# Patient Record
Sex: Male | Born: 1985 | Race: Black or African American | Hispanic: No | Marital: Single | Smoking: Current every day smoker
Health system: Southern US, Community
[De-identification: ages and names within clinical notes are randomized; demographics above are authoritative.]

## PROBLEM LIST (undated history)

## (undated) HISTORY — PX: HERNIA REPAIR: SHX51

---

## 2014-09-26 ENCOUNTER — Emergency Department (HOSPITAL_BASED_OUTPATIENT_CLINIC_OR_DEPARTMENT_OTHER)
Admission: EM | Admit: 2014-09-26 | Discharge: 2014-09-26 | Disposition: A | Discharge status: Home / Self Care | Payer: Self-pay | Attending: Emergency Medicine | Admitting: Emergency Medicine

## 2014-09-26 ENCOUNTER — Encounter (HOSPITAL_BASED_OUTPATIENT_CLINIC_OR_DEPARTMENT_OTHER): Payer: Self-pay | Admitting: Emergency Medicine

## 2014-09-26 DIAGNOSIS — G4489 Other headache syndrome: Secondary | ICD-10-CM | POA: Insufficient documentation

## 2014-09-26 DIAGNOSIS — Z720 Tobacco use: Secondary | ICD-10-CM | POA: Insufficient documentation

## 2014-09-26 MED ORDER — METOCLOPRAMIDE HCL 5 MG/ML IJ SOLN
10.0000 mg | INTRAMUSCULAR | Status: AC
  Administered 2014-09-26: 10 mg via INTRAVENOUS
  Filled 2014-09-26: qty 2

## 2014-09-26 MED ORDER — KETOROLAC TROMETHAMINE 30 MG/ML IJ SOLN
30.0000 mg | Freq: Once | INTRAMUSCULAR | Status: AC
  Administered 2014-09-26: 30 mg via INTRAVENOUS
  Filled 2014-09-26: qty 1

## 2014-09-26 MED ORDER — DIPHENHYDRAMINE HCL 50 MG/ML IJ SOLN
25.0000 mg | Freq: Once | INTRAMUSCULAR | Status: AC
  Administered 2014-09-26: 25 mg via INTRAVENOUS
  Filled 2014-09-26: qty 1

## 2014-09-26 MED ORDER — SODIUM CHLORIDE 0.9 % IV BOLUS (SEPSIS)
1000.0000 mL | Freq: Once | INTRAVENOUS | Status: AC
  Administered 2014-09-26: 1000 mL via INTRAVENOUS

## 2014-09-26 NOTE — ED Notes (Addendum)
Pt presents to ED with complaints of headache since yesterday and hands feel "tingly" since today.  PT also reports neck and facial pain.

## 2014-09-26 NOTE — Discharge Instructions (Signed)
Drink plenty of water to maintain hydration. Refer to attached documents for further evaluation.

## 2014-09-26 NOTE — ED Provider Notes (Signed)
CSN: [id number]     Arrival date & time 09/26/14  1316 History   First MD Initiated Contact with Patient 09/26/14 1334     Chief Complaint  Patient presents with  . Headache     (Consider location/radiation/quality/duration/timing/severity/associated sxs/prior Treatment) HPI Comments: Patient is a 28 year old male with a past medical history of migraines who presents with a headache for 1 day. Patient reports a gradual onset and progressive worsening of the headache. The pain is sharp, constant and is located in generalized head without radiation. Patient has tried nothing for symptoms without relief. No alleviating/aggravating factors. Patient reports associated nausea and photophobia. Patient denies fever, vomiting, diarrhea, numbness/tingling, weakness, visual changes, congestion, chest pain, SOB, abdominal pain.      History reviewed. No pertinent past medical history. History reviewed. No pertinent past surgical history. No family history on file. History  Substance Use Topics  . Smoking status: Current Every Day Smoker  . Smokeless tobacco: Not on file  . Alcohol Use: Not on file    Review of Systems  Constitutional: Negative for fever, chills and fatigue.  HENT: Negative for trouble swallowing.   Eyes: Negative for visual disturbance.  Respiratory: Negative for shortness of breath.   Cardiovascular: Negative for chest pain and palpitations.  Gastrointestinal: Negative for nausea, vomiting, abdominal pain and diarrhea.  Genitourinary: Negative for dysuria and difficulty urinating.  Musculoskeletal: Negative for arthralgias and neck pain.  Skin: Negative for color change.  Neurological: Positive for headaches. Negative for dizziness and weakness.  Psychiatric/Behavioral: Negative for dysphoric mood.      Allergies  Review of patient's allergies indicates no known allergies.  Home Medications   Prior to Admission medications   Not on File   BP 160/100 mmHg   Pulse 68  Temp(Src) 98.2 F (36.8 C) (Oral)  Resp 18  Ht 6\' 1"  (1.854 m)  Wt 152 lb (68.947 kg)  BMI 20.06 kg/m2  SpO2 98% Physical Exam  Constitutional: He is oriented to person, place, and time. He appears well-developed and well-nourished. No distress.  HENT:  Head: Normocephalic and atraumatic.  Eyes: Conjunctivae and EOM are normal.  Neck: Normal range of motion.  Cardiovascular: Normal rate and regular rhythm.  Exam reveals no gallop and no friction rub.   No murmur heard. Pulmonary/Chest: Effort normal and breath sounds normal. He has no wheezes. He has no rales. He exhibits no tenderness.  Abdominal: Soft. He exhibits no distension. There is no tenderness. There is no rebound.  Musculoskeletal: Normal range of motion.  Neurological: He is alert and oriented to person, place, and time. No cranial nerve deficit. Coordination normal.  Patient abel to ambulate without difficulty. Speech is goal-oriented. Moves limbs without ataxia.   Skin: Skin is warm and dry.  Psychiatric: He has a normal mood and affect. His behavior is normal.  Nursing note and vitals reviewed.   ED Course  Procedures (including critical care time) Labs Review Labs Reviewed - No data to display  Imaging Review No results found.   EKG Interpretation None      MDM   Final diagnoses:  Other headache syndrome    1:54 PM Patient will have migraine cocktail. Vitals stable and patient afebrile. No neuro deficits or meningeal signs.   4:16 PM Patient feeling better and will be discharged. Vitals stable and patient afebrile.    Emilia BeckKaitlyn Reona Zendejas, PA-C 09/26/14 1620  Vanetta MuldersScott Zackowski, MD 09/28/14 [phone]

## 2019-04-28 ENCOUNTER — Encounter (HOSPITAL_BASED_OUTPATIENT_CLINIC_OR_DEPARTMENT_OTHER): Payer: Self-pay | Admitting: Emergency Medicine

## 2019-04-28 ENCOUNTER — Other Ambulatory Visit: Payer: Self-pay

## 2019-04-28 ENCOUNTER — Emergency Department (HOSPITAL_BASED_OUTPATIENT_CLINIC_OR_DEPARTMENT_OTHER): Payer: Self-pay

## 2019-04-28 ENCOUNTER — Emergency Department (HOSPITAL_BASED_OUTPATIENT_CLINIC_OR_DEPARTMENT_OTHER)
Admission: EM | Admit: 2019-04-28 | Discharge: 2019-04-28 | Disposition: A | Discharge status: Home / Self Care | Payer: Self-pay | Attending: Emergency Medicine | Admitting: Emergency Medicine

## 2019-04-28 DIAGNOSIS — F17290 Nicotine dependence, other tobacco product, uncomplicated: Secondary | ICD-10-CM | POA: Insufficient documentation

## 2019-04-28 DIAGNOSIS — F1210 Cannabis abuse, uncomplicated: Secondary | ICD-10-CM | POA: Insufficient documentation

## 2019-04-28 DIAGNOSIS — T148XXA Other injury of unspecified body region, initial encounter: Secondary | ICD-10-CM | POA: Insufficient documentation

## 2019-04-28 DIAGNOSIS — M545 Low back pain, unspecified: Secondary | ICD-10-CM

## 2019-04-28 DIAGNOSIS — Y998 Other external cause status: Secondary | ICD-10-CM | POA: Insufficient documentation

## 2019-04-28 DIAGNOSIS — Y9389 Activity, other specified: Secondary | ICD-10-CM | POA: Insufficient documentation

## 2019-04-28 DIAGNOSIS — X58XXXA Exposure to other specified factors, initial encounter: Secondary | ICD-10-CM | POA: Insufficient documentation

## 2019-04-28 DIAGNOSIS — Y929 Unspecified place or not applicable: Secondary | ICD-10-CM | POA: Insufficient documentation

## 2019-04-28 MED ORDER — NAPROXEN 250 MG PO TABS
500.0000 mg | ORAL_TABLET | Freq: Once | ORAL | Status: AC
  Administered 2019-04-28: 500 mg via ORAL
  Filled 2019-04-28: qty 2

## 2019-04-28 MED ORDER — CYCLOBENZAPRINE HCL 10 MG PO TABS: 10.0000 mg | ORAL_TABLET | Freq: Two times a day (BID) | ORAL | 0 refills | Status: AC | PRN
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
## 2019-04-28 NOTE — Discharge Instructions (Addendum)
You were evaluated in the Emergency Department and after careful evaluation, we did not find any emergent condition requiring admission or further testing in the hospital.  Your symptoms today seem to be due to muscle strain or spasm.  Your x-rays did not show any broken bones.  Please use Tylenol or ibuprofen during the day for pain.  You can use the muscle relaxer provided if you are having trouble sleeping at night due to the pain.  Please use caution and avoid alcohol while using muscle relaxers as they can cause you to be sleepy.  Please return to the Emergency Department if you experience any worsening of your condition.  We encourage you to follow up with a primary care provider.  Thank you for allowing [place] to be a part of your care.
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
## 2019-04-28 NOTE — ED Notes (Signed)
Patient transported to X-ray 
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
## 2019-10-26 ENCOUNTER — Emergency Department (HOSPITAL_COMMUNITY)
Admission: EM | Admit: 2019-10-26 | Discharge: 2019-10-26 | Disposition: A | Discharge status: Home / Self Care | Payer: No Typology Code available for payment source | Attending: Emergency Medicine | Admitting: Emergency Medicine

## 2019-10-26 ENCOUNTER — Emergency Department (HOSPITAL_COMMUNITY): Payer: No Typology Code available for payment source

## 2019-10-26 ENCOUNTER — Other Ambulatory Visit: Payer: Self-pay

## 2019-10-26 ENCOUNTER — Encounter (HOSPITAL_COMMUNITY): Payer: Self-pay | Admitting: Emergency Medicine

## 2019-10-26 DIAGNOSIS — F17210 Nicotine dependence, cigarettes, uncomplicated: Secondary | ICD-10-CM | POA: Diagnosis not present

## 2019-10-26 DIAGNOSIS — S53401A Unspecified sprain of right elbow, initial encounter: Secondary | ICD-10-CM | POA: Diagnosis not present

## 2019-10-26 DIAGNOSIS — S8002XA Contusion of left knee, initial encounter: Secondary | ICD-10-CM | POA: Diagnosis not present

## 2019-10-26 DIAGNOSIS — Y990 Civilian activity done for income or pay: Secondary | ICD-10-CM | POA: Diagnosis not present

## 2019-10-26 DIAGNOSIS — Y92410 Unspecified street and highway as the place of occurrence of the external cause: Secondary | ICD-10-CM | POA: Insufficient documentation

## 2019-10-26 DIAGNOSIS — S8000XA Contusion of unspecified knee, initial encounter: Secondary | ICD-10-CM

## 2019-10-26 DIAGNOSIS — S8001XA Contusion of right knee, initial encounter: Secondary | ICD-10-CM | POA: Diagnosis not present

## 2019-10-26 DIAGNOSIS — R0789 Other chest pain: Secondary | ICD-10-CM | POA: Insufficient documentation

## 2019-10-26 DIAGNOSIS — S59901A Unspecified injury of right elbow, initial encounter: Secondary | ICD-10-CM | POA: Diagnosis present

## 2019-10-26 DIAGNOSIS — Y9389 Activity, other specified: Secondary | ICD-10-CM | POA: Insufficient documentation
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
## 2019-10-26 NOTE — ED Notes (Signed)
Patient in Xray at this time.

## 2020-12-17 IMAGING — CR THORACIC SPINE 2 VIEWS
3 series · 3 of 3 positions shown · non-contrast
Comparison: None.

CLINICAL DATA: Fall 2 days ago. Thoracic back pain. Initial
encounter.

EXAM:
THORACIC SPINE 2 VIEWS

[w t-spine a.p. *]
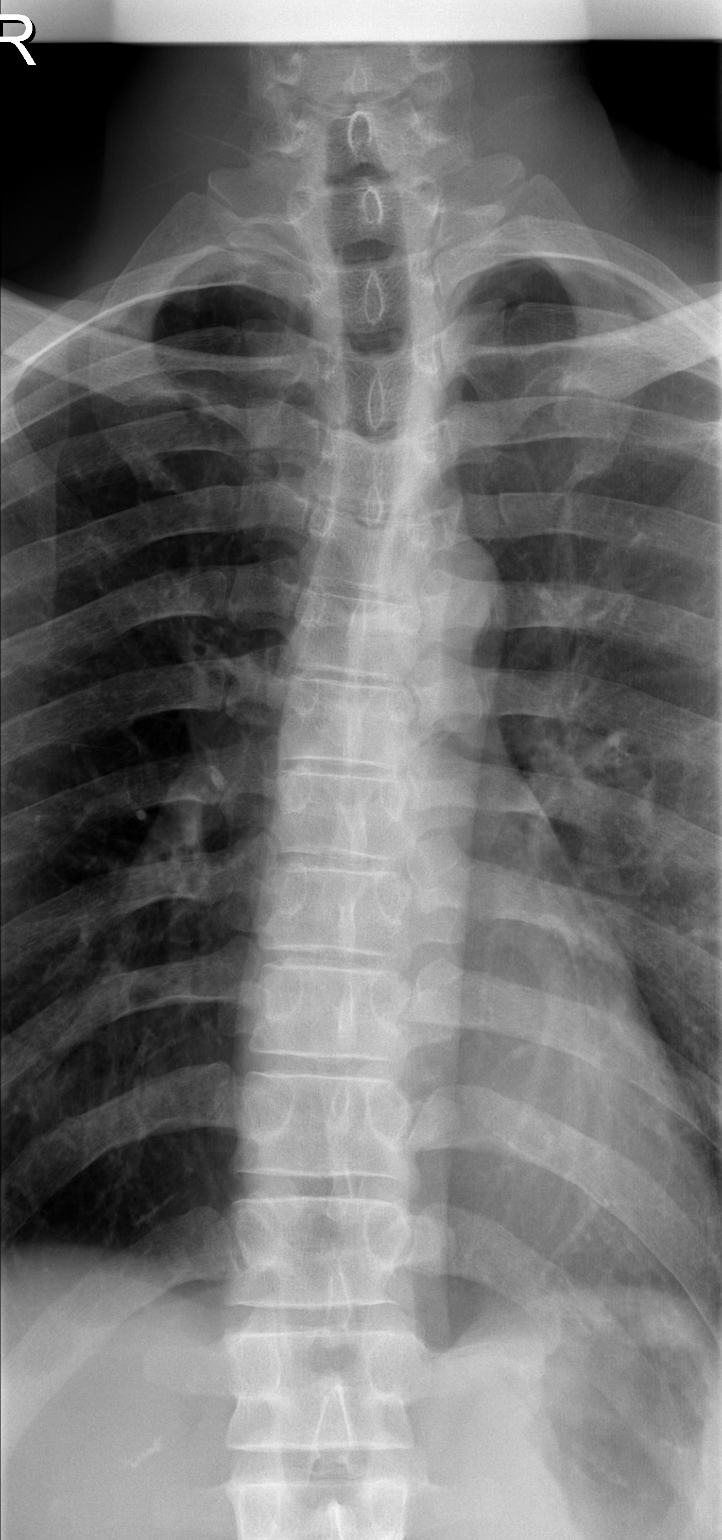

[w t-spine lat *]
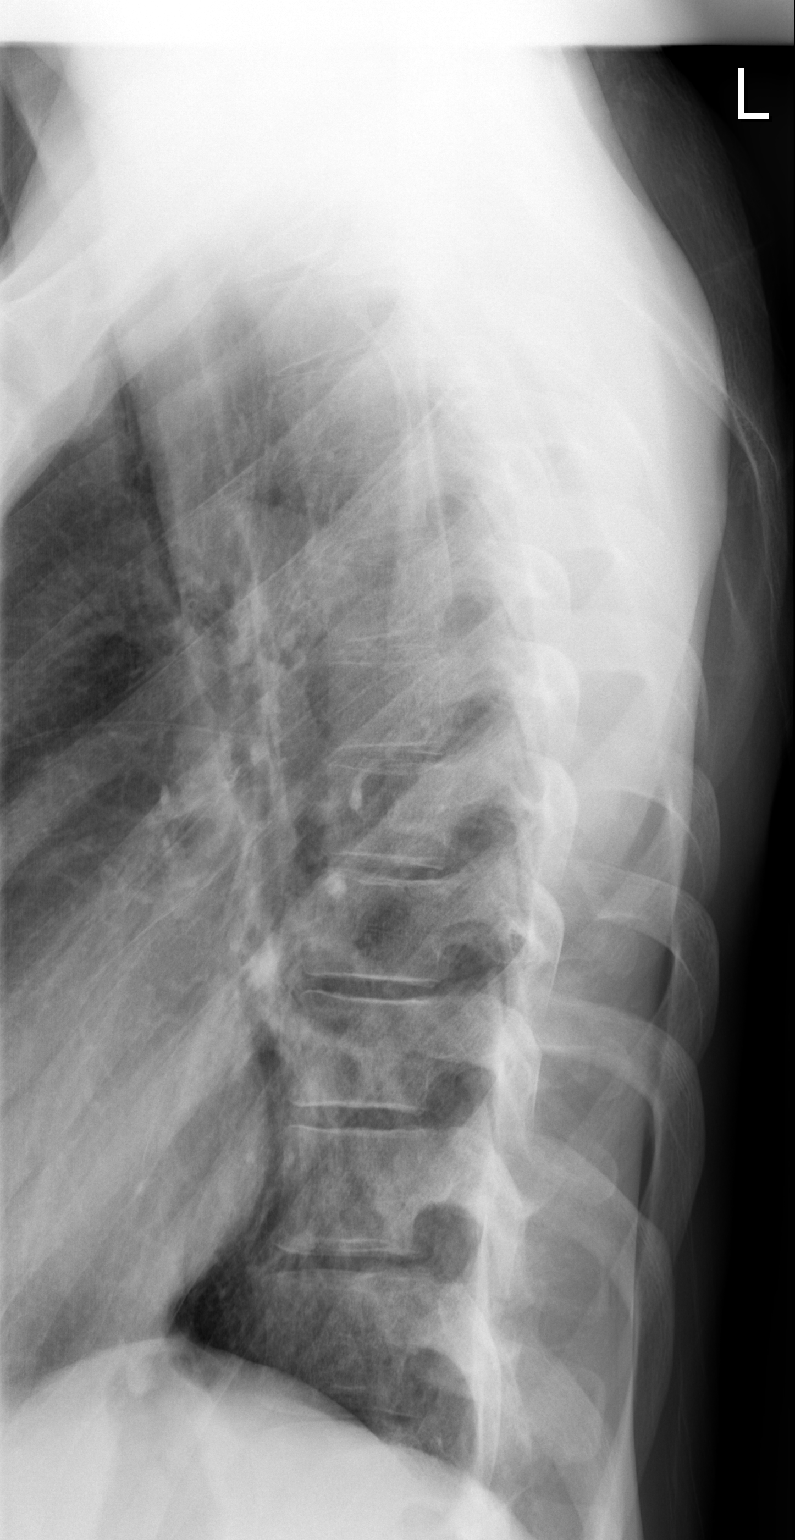

[w swimmers view]
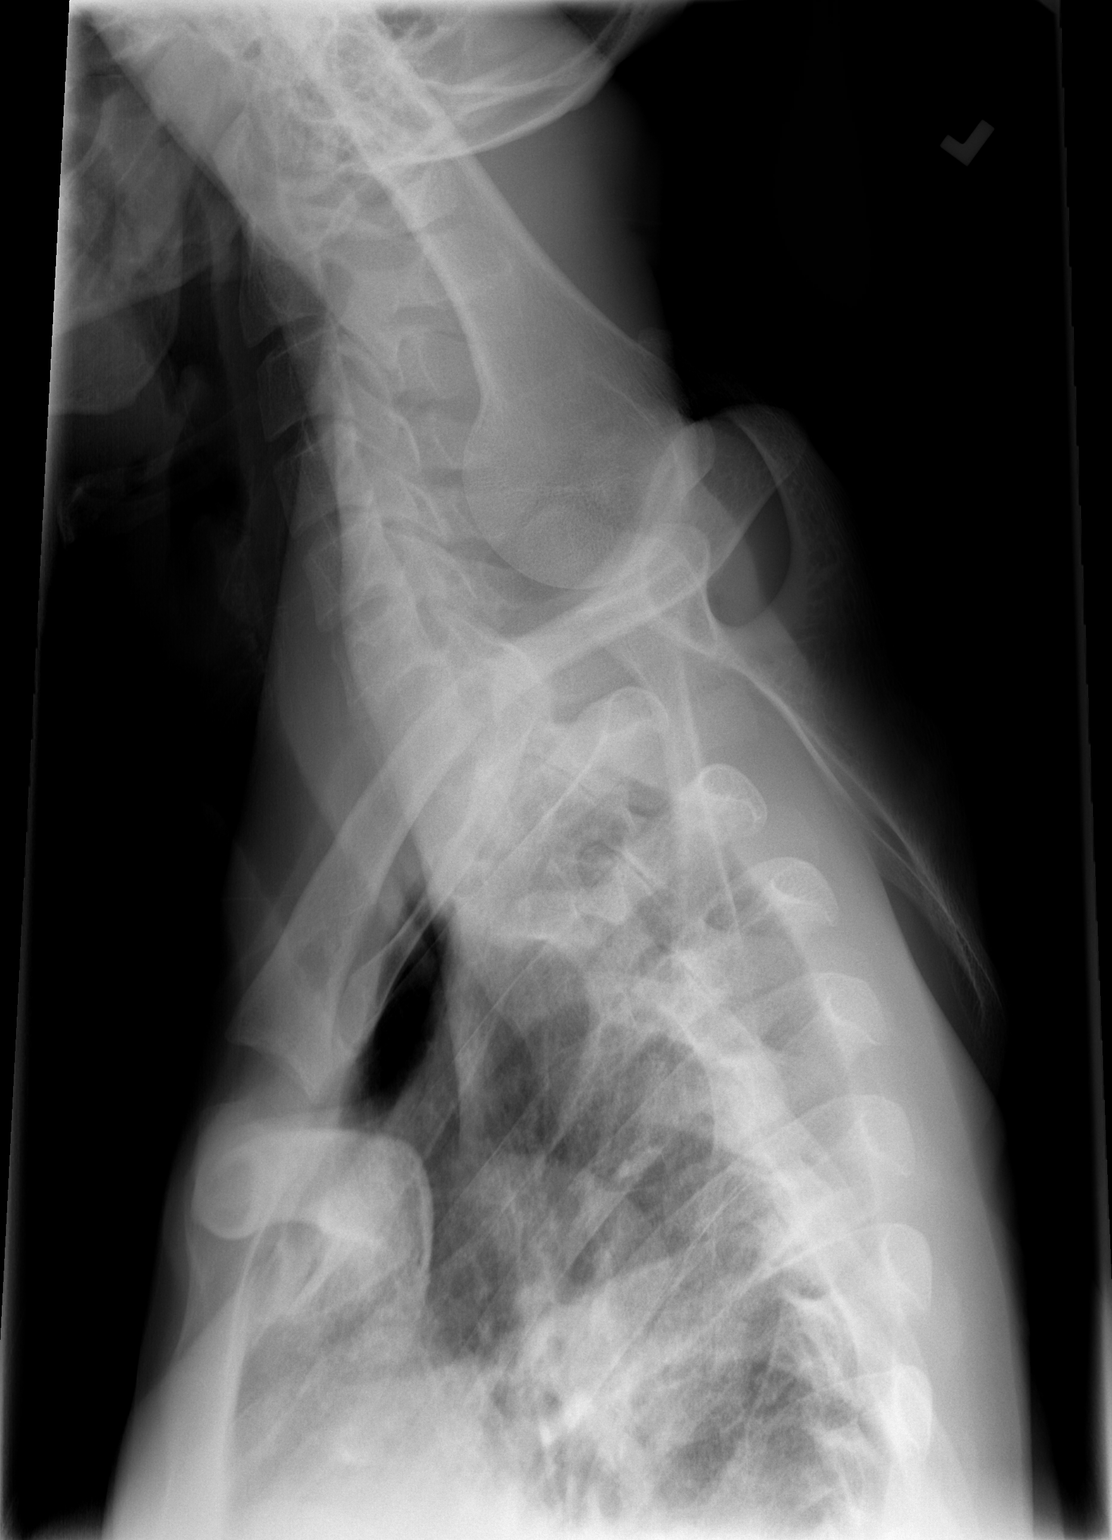

[3 of 3 positions shown; findings below may reference images not displayed]

FINDINGS: There is no evidence of thoracic spine fracture. Alignment is
normal. No focal bone lesions identified. Mild upper thoracic
levoscoliosis noted.
IMPRESSION: No acute findings. Mild upper thoracic levoscoliosis.

## 2021-03-30 ENCOUNTER — Encounter (HOSPITAL_BASED_OUTPATIENT_CLINIC_OR_DEPARTMENT_OTHER): Payer: Self-pay | Admitting: *Deleted

## 2021-03-30 ENCOUNTER — Emergency Department (HOSPITAL_BASED_OUTPATIENT_CLINIC_OR_DEPARTMENT_OTHER)
Admission: EM | Admit: 2021-03-30 | Discharge: 2021-03-30 | Disposition: A | Discharge status: Home / Self Care | Payer: Self-pay | Attending: Emergency Medicine | Admitting: Emergency Medicine

## 2021-03-30 ENCOUNTER — Emergency Department (HOSPITAL_BASED_OUTPATIENT_CLINIC_OR_DEPARTMENT_OTHER): Payer: Self-pay

## 2021-03-30 ENCOUNTER — Other Ambulatory Visit: Payer: Self-pay

## 2021-03-30 DIAGNOSIS — F17290 Nicotine dependence, other tobacco product, uncomplicated: Secondary | ICD-10-CM | POA: Insufficient documentation

## 2021-03-30 DIAGNOSIS — Y92008 Other place in unspecified non-institutional (private) residence as the place of occurrence of the external cause: Secondary | ICD-10-CM | POA: Insufficient documentation

## 2021-03-30 DIAGNOSIS — Y9301 Activity, walking, marching and hiking: Secondary | ICD-10-CM | POA: Insufficient documentation

## 2021-03-30 DIAGNOSIS — S99921A Unspecified injury of right foot, initial encounter: Secondary | ICD-10-CM | POA: Insufficient documentation

## 2021-03-30 DIAGNOSIS — W1841XA Slipping, tripping and stumbling without falling due to stepping on object, initial encounter: Secondary | ICD-10-CM | POA: Insufficient documentation

## 2021-03-30 MED ORDER — IBUPROFEN 400 MG PO TABS
600.0000 mg | ORAL_TABLET | Freq: Once | ORAL | Status: AC
  Administered 2021-03-30: 600 mg via ORAL
  Filled 2021-03-30: qty 1

## 2021-03-30 NOTE — ED Triage Notes (Signed)
C/o right foot injury when stepping of porch today

## 2021-03-30 NOTE — Discharge Instructions (Addendum)
Please read instructions below. Apply ice to your foot for 20 minutes at a time. You can take 600 mg ibuprofen every 6 hours as needed for pain. If you continue to have pain, it is recommended you remain nonweightbearing. Schedule an appointment with the sports medicine specialist in 1 to 2 weeks for repeat x-ray and follow-up on your injury. Return to the ER for new or concerning symptoms.

## 2021-03-30 NOTE — ED Provider Notes (Signed)
[facility] EMERGENCY DEPARTMENT Provider Note   CSN: [id number] Arrival date & time: 03/30/21  1815     History Chief Complaint  Patient presents with  . Foot Injury    Terry Adams is a 35 y.o. male presenting for evaluation of sudden onset of right foot pain began prior to arrival.  Patient states he was walking down the steps and one of his dogs ran between his legs.  It caused him to lose his footing and slipped when he was stepping down third toe right foot.  He is unsure of how his foot twisted though he began having pain across the dorsal aspect of his proximal foot.  No prior injuries to this foot.  Did not treat his symptoms prior to arrival.  No wounds or numbness.  No other injuries reported.     History reviewed. No pertinent past medical history.  There are no problems to display for this patient.   Past Surgical History:  Procedure Laterality Date  . HERNIA REPAIR         No family history on file.  Social History   Tobacco Use  . Smoking status: Current Every Day Smoker    Types: Cigars  . Smokeless tobacco: Never Used  Substance Use Topics  . Alcohol use: Yes  . Drug use: Yes    Types: Marijuana    Home Medications Prior to Admission medications   Medication Sig Start Date End Date Taking? Authorizing Provider  cyclobenzaprine (FLEXERIL) 10 MG tablet Take 1 tablet (10 mg total) by mouth 2 (two) times daily as needed for muscle spasms. 04/28/19   Sabas Sous, MD    Allergies    Patient has no known allergies.  Review of Systems   Review of Systems  Musculoskeletal: Positive for arthralgias.  Skin: Negative for wound.    Physical Exam Updated Vital Signs BP 136/82 (BP Location: Right Arm)   Pulse 71   Temp 98.4 F (36.9 C) (Oral)   Resp 16   Ht 6\' 1"  (1.854 m)   Wt 72.6 kg   SpO2 100%   BMI 21.11 kg/m   Physical Exam Vitals and nursing note reviewed.  Constitutional:      Appearance: He is well-developed.  HENT:      Head: Normocephalic and atraumatic.  Eyes:     Conjunctiva/sclera: Conjunctivae normal.  Cardiovascular:     Rate and Rhythm: Normal rate.  Pulmonary:     Effort: Pulmonary effort is normal.  Musculoskeletal:     Comments: Dorsal proximal right foot with mild swelling, generalized tenderness.  No obvious deformity.  Patient is able to range the ankle, though does have pain with range of motion of the foot.  Strong DP pulse and normal distal sensation.  No wounds.  Neurological:     Mental Status: He is alert.  Psychiatric:        Mood and Affect: Mood normal.        Behavior: Behavior normal.     ED Results / Procedures / Treatments   Labs (all labs ordered are listed, but only abnormal results are displayed) Labs Reviewed - No data to display  EKG None  Radiology DG Foot Complete Right  Result Date: 03/30/2021 CLINICAL DATA:  Twisting injury today with foot pain, initial encounter EXAM: RIGHT FOOT COMPLETE - 3+ VIEW COMPARISON:  None. FINDINGS: Mild hallux valgus deformity is noted. No acute fracture or dislocation is seen. No soft tissue abnormality is noted. Mild flattening  of the plantar arch is noted. IMPRESSION: Mild degenerative changes without acute abnormality. Electronically Signed   By: Alcide Clever M.D.   On: 03/30/2021 18:39    Procedures Procedures   Medications Ordered in ED Medications  ibuprofen (ADVIL) tablet 600 mg (600 mg Oral Given 03/30/21 2108)    ED Course  I have reviewed the triage vital signs and the nursing notes.  Pertinent labs & imaging results that were available during my care of the patient were reviewed by me and considered in my medical decision making (see chart for details).    MDM Rules/Calculators/A&P                          Patient is presenting with right foot pain after slipping down the steps.  X-ray is negative for acute fracture.  Suspect sprain versus strain though did discuss possibility of occult fracture and  recommendations for nonweightbearing and close outpatient follow-up for repeat imaging in 1 to 2 weeks.  Recommend RICE therapy, NSAIDs for pain.  Referral to sports medicine.  Discussed results, findings, treatment and follow up. Patient advised of return precautions. Patient verbalized understanding and agreed with plan.  Final Clinical Impression(s) / ED Diagnoses Final diagnoses:  Injury of right foot, initial encounter    Rx / DC Orders ED Discharge Orders    None       Bethenny Losee, [place] N, PA-C 03/30/21 2359    Terrilee Files, MD 03/31/21 [phone]

## 2021-04-06 ENCOUNTER — Ambulatory Visit (INDEPENDENT_AMBULATORY_CARE_PROVIDER_SITE_OTHER): Payer: BC Managed Care – PPO | Admitting: Family Medicine

## 2021-04-06 ENCOUNTER — Encounter: Payer: Self-pay | Admitting: Family Medicine

## 2021-04-06 ENCOUNTER — Other Ambulatory Visit: Payer: Self-pay

## 2021-04-06 ENCOUNTER — Ambulatory Visit: Payer: Self-pay

## 2021-04-06 VITALS — BP 120/62 | Ht 73.0 in | Wt 160.0 lb

## 2021-04-06 DIAGNOSIS — M79671 Pain in right foot: Secondary | ICD-10-CM

## 2021-04-06 DIAGNOSIS — S93601A Unspecified sprain of right foot, initial encounter: Secondary | ICD-10-CM | POA: Diagnosis not present

## 2021-04-06 NOTE — Progress Notes (Signed)
  Terry Adams - 35 y.o. male MRN [id number]  Date of birth: [birth year]  SUBJECTIVE:  Including CC & ROS.  No chief complaint on file.   Terry Adams is a 35 y.o. male that is presenting with right foot pain.  He had a plantarflexed injury to his foot since that time he has had this midfoot pain.  He was seen in emergency department and placed in a cam walker..  Independent review of right foot x-ray from 5/19 shows no acute bony changes.   Review of Systems See HPI   HISTORY: Past Medical, Surgical, Social, and Family History Reviewed & Updated per EMR.   Pertinent Historical Findings include:  History reviewed. No pertinent past medical history.  Past Surgical History:  Procedure Laterality Date  . HERNIA REPAIR      History reviewed. No pertinent family history.  Social History   Socioeconomic History  . Marital status: Single    Spouse name: Not on file  . Number of children: Not on file  . Years of education: Not on file  . Highest education level: Not on file  Occupational History  . Not on file  Tobacco Use  . Smoking status: Current Every Day Smoker    Types: Cigars  . Smokeless tobacco: Never Used  Substance and Sexual Activity  . Alcohol use: Yes  . Drug use: Yes    Types: Marijuana  . Sexual activity: Not on file  Other Topics Concern  . Not on file  Social History Narrative  . Not on file   Social Determinants of Health   Financial Resource Strain: Not on file  Food Insecurity: Not on file  Transportation Needs: Not on file  Physical Activity: Not on file  Stress: Not on file  Social Connections: Not on file  Intimate Partner Violence: Not on file     PHYSICAL EXAM:  VS: BP 120/62 (BP Location: Right Arm, Patient Position: Sitting, Cuff Size: Normal)   Ht 6\' 1"  (1.854 m)   Wt 160 lb (72.6 kg)   BMI 21.11 kg/m  Physical Exam Gen: NAD, alert, cooperative with exam, well-appearing MSK:  Right foot: Normal range of motion. No swelling or  ecchymosis Some tenderness palpation of the midfoot. Neurovascular intact  Limited ultrasound: Right foot:  No ankle effusion. No change appreciated of the posterior tibialis and at the insertion. Normal-appearing peroneal tendons and insertion to the base of the fifth metatarsal. No changes through the midfoot. No changes at the base of the first metatarsal.   Summary: No structural changes appreciated  Ultrasound and interpretation by , MD    ASSESSMENT & PLAN:   Foot sprain, right, initial encounter Symptoms seem more associated with a sprain of the midfoot.  Has some tenderness over the Lisfranc ligament but no widening and no bruising appreciated on plantar aspect. -Counseled home exercise therapy and supportive care. -Green sport insoles. -Could consider physical therapy

## 2021-04-06 NOTE — Assessment & Plan Note (Addendum)
Symptoms seem more associated with a sprain of the midfoot.  Has some tenderness over the Lisfranc ligament but no widening and no bruising appreciated on plantar aspect. -Counseled home exercise therapy and supportive care. -Green sport insoles. -Could consider physical therapy

## 2021-04-06 NOTE — Patient Instructions (Signed)
Nice to meet you Please try ice  Please try ibuprofen if needed  Run your work shoes by and we can supply some insoles and support   Please send me a message in MyChart with any questions or updates.  Please see me back in 4 weeks.   --Dr. Jordan Likes

## 2021-05-09 ENCOUNTER — Ambulatory Visit: Payer: BC Managed Care – PPO | Admitting: Family Medicine

## 2021-05-09 NOTE — Progress Notes (Deleted)
  Bertis Hustead - 35 y.o. male MRN [id number]  Date of birth: January 26, 1986  SUBJECTIVE:  Including CC & ROS.  No chief complaint on file.   Harlo Fabela is a 35 y.o. male that is  ***.  ***   Review of Systems See HPI   HISTORY: Past Medical, Surgical, Social, and Family History Reviewed & Updated per EMR.   Pertinent Historical Findings include:  No past medical history on file.  Past Surgical History:  Procedure Laterality Date  . HERNIA REPAIR      No family history on file.  Social History   Socioeconomic History  . Marital status: Single    Spouse name: Not on file  . Number of children: Not on file  . Years of education: Not on file  . Highest education level: Not on file  Occupational History  . Not on file  Tobacco Use  . Smoking status: Every Day    Pack years: 0.00    Types: Cigars  . Smokeless tobacco: Never  Substance and Sexual Activity  . Alcohol use: Yes  . Drug use: Yes    Types: Marijuana  . Sexual activity: Not on file  Other Topics Concern  . Not on file  Social History Narrative  . Not on file   Social Determinants of Health   Financial Resource Strain: Not on file  Food Insecurity: Not on file  Transportation Needs: Not on file  Physical Activity: Not on file  Stress: Not on file  Social Connections: Not on file  Intimate Partner Violence: Not on file     PHYSICAL EXAM:  VS: There were no vitals taken for this visit. Physical Exam Gen: NAD, alert, cooperative with exam, well-appearing MSK:  ***      ASSESSMENT & PLAN:   No problem-specific Assessment & Plan notes found for this encounter.

## 2023-02-25 ENCOUNTER — Encounter: Payer: Self-pay | Admitting: *Deleted
